# Patient Record
Sex: Female | Born: 1990 | Race: White | Hispanic: No | Marital: Single | Smoking: Current every day smoker
Health system: Southern US, Community
[De-identification: ages and names within clinical notes are randomized; demographics above are authoritative.]

## PROBLEM LIST (undated history)

## (undated) DIAGNOSIS — R011 Cardiac murmur, unspecified: Secondary | ICD-10-CM

## (undated) DIAGNOSIS — F329 Major depressive disorder, single episode, unspecified: Secondary | ICD-10-CM

## (undated) DIAGNOSIS — F32A Depression, unspecified: Secondary | ICD-10-CM

## (undated) DIAGNOSIS — J40 Bronchitis, not specified as acute or chronic: Secondary | ICD-10-CM

---

## 2010-04-13 ENCOUNTER — Emergency Department (HOSPITAL_COMMUNITY)
Admission: EM | Admit: 2010-04-13 | Discharge: 2010-04-13 | Payer: Self-pay | Source: Home / Self Care | Admitting: Emergency Medicine

## 2010-04-15 LAB — DIFFERENTIAL
Basophils Absolute: 0 10*3/uL (ref 0.0–0.1)
Basophils Relative: 1 % (ref 0–1)
Eosinophils Absolute: 0.2 10*3/uL (ref 0.0–0.7)
Eosinophils Relative: 3 % (ref 0–5)
Lymphocytes Relative: 26 % (ref 12–46)
Lymphs Abs: 2 10*3/uL (ref 0.7–4.0)
Monocytes Absolute: 0.7 10*3/uL (ref 0.1–1.0)
Monocytes Relative: 8 % (ref 3–12)
Neutro Abs: 5 10*3/uL (ref 1.7–7.7)
Neutrophils Relative %: 63 % (ref 43–77)

## 2010-04-15 LAB — COMPREHENSIVE METABOLIC PANEL
ALT: 14 U/L (ref 0–35)
AST: 24 U/L (ref 0–37)
Albumin: 3.5 g/dL (ref 3.5–5.2)
Alkaline Phosphatase: 49 U/L (ref 39–117)
BUN: 8 mg/dL (ref 6–23)
CO2: 25 mEq/L (ref 19–32)
Calcium: 9.3 mg/dL (ref 8.4–10.5)
Chloride: 107 mEq/L (ref 96–112)
Creatinine, Ser: 0.67 mg/dL (ref 0.4–1.2)
GFR calc Af Amer: >60 mL/min (ref >60)
GFR calc non Af Amer: >60 mL/min (ref >60)
Glucose, Bld: 84 mg/dL (ref 70–99)
Potassium: 4 mEq/L (ref 3.5–5.1)
Sodium: 140 mEq/L (ref 135–145)
Total Bilirubin: 0.4 mg/dL (ref 0.3–1.2)
Total Protein: 6.3 g/dL (ref 6.0–8.3)

## 2010-04-15 LAB — CBC
HCT: 36.6 % (ref 36.0–46.0)
Hemoglobin: 12.2 g/dL (ref 12.0–15.0)
MCH: 27.9 pg (ref 26.0–34.0)
MCHC: 33.3 g/dL (ref 30.0–36.0)
MCV: 83.8 fL (ref 78.0–100.0)
Platelets: 300 10*3/uL (ref 150–400)
RBC: 4.37 MIL/uL (ref 3.87–5.11)
RDW: 14.2 % (ref 11.5–15.5)
WBC: 8 10*3/uL (ref 4.0–10.5)

## 2010-04-15 LAB — POCT PREGNANCY, URINE: Preg Test, Ur: NEGATIVE

## 2010-04-15 LAB — URINALYSIS, ROUTINE W REFLEX MICROSCOPIC
Bilirubin Urine: NEGATIVE
Hgb urine dipstick: NEGATIVE
Ketones, ur: NEGATIVE mg/dL
Leukocytes, UA: NEGATIVE
Nitrite: NEGATIVE
Protein, ur: 30 mg/dL — AB
Specific Gravity, Urine: 1.015 (ref 1.005–1.030)
Urine Glucose, Fasting: NEGATIVE mg/dL
Urobilinogen, UA: 0.2 mg/dL (ref 0.0–1.0)
pH: 6.5 (ref 5.0–8.0)

## 2010-04-15 LAB — PREGNANCY, URINE: Preg Test, Ur: NEGATIVE

## 2010-04-15 LAB — URINE MICROSCOPIC-ADD ON

## 2010-04-15 LAB — LIPASE, BLOOD: Lipase: 23 U/L (ref 11–59)

## 2010-04-17 ENCOUNTER — Emergency Department (HOSPITAL_COMMUNITY)
Admission: EM | Admit: 2010-04-17 | Discharge: 2010-04-17 | Payer: Self-pay | Source: Home / Self Care | Admitting: Emergency Medicine

## 2010-04-20 LAB — DIFFERENTIAL
Basophils Absolute: 0.1 10*3/uL (ref 0.0–0.1)
Basophils Relative: 1 % (ref 0–1)
Eosinophils Absolute: 0.1 10*3/uL (ref 0.0–0.7)
Eosinophils Relative: 1 % (ref 0–5)
Lymphocytes Relative: 33 % (ref 12–46)
Lymphs Abs: 2.6 10*3/uL (ref 0.7–4.0)
Monocytes Absolute: 0.7 10*3/uL (ref 0.1–1.0)
Monocytes Relative: 8 % (ref 3–12)
Neutro Abs: 4.4 10*3/uL (ref 1.7–7.7)
Neutrophils Relative %: 56 % (ref 43–77)

## 2010-04-20 LAB — COMPREHENSIVE METABOLIC PANEL
ALT: 15 U/L (ref 0–35)
AST: 24 U/L (ref 0–37)
Albumin: 4.1 g/dL (ref 3.5–5.2)
Alkaline Phosphatase: 61 U/L (ref 39–117)
BUN: 6 mg/dL (ref 6–23)
CO2: 26 mEq/L (ref 19–32)
Calcium: 9.7 mg/dL (ref 8.4–10.5)
Chloride: 103 mEq/L (ref 96–112)
Creatinine, Ser: 0.69 mg/dL (ref 0.4–1.2)
GFR calc Af Amer: >60 mL/min (ref >60)
GFR calc non Af Amer: >60 mL/min (ref >60)
Glucose, Bld: 97 mg/dL (ref 70–99)
Potassium: 3.7 mEq/L (ref 3.5–5.1)
Sodium: 140 mEq/L (ref 135–145)
Total Bilirubin: 0.4 mg/dL (ref 0.3–1.2)
Total Protein: 7.4 g/dL (ref 6.0–8.3)

## 2010-04-20 LAB — HCG, QUANTITATIVE, PREGNANCY: hCG, Beta Chain, Quant, S: <2 m[IU]/mL (ref <5)

## 2010-04-20 LAB — URINE MICROSCOPIC-ADD ON

## 2010-04-20 LAB — WET PREP, GENITAL: Yeast Wet Prep HPF POC: NONE SEEN

## 2010-04-20 LAB — CBC
HCT: 40.8 % (ref 36.0–46.0)
Hemoglobin: 14.1 g/dL (ref 12.0–15.0)
MCH: 28.7 pg (ref 26.0–34.0)
MCHC: 34.6 g/dL (ref 30.0–36.0)
MCV: 82.9 fL (ref 78.0–100.0)
Platelets: 417 10*3/uL — ABNORMAL HIGH (ref 150–400)
RBC: 4.92 MIL/uL (ref 3.87–5.11)
RDW: 14.1 % (ref 11.5–15.5)
WBC: 7.8 10*3/uL (ref 4.0–10.5)

## 2010-04-20 LAB — URINALYSIS, ROUTINE W REFLEX MICROSCOPIC
Bilirubin Urine: NEGATIVE
Hgb urine dipstick: NEGATIVE
Ketones, ur: NEGATIVE mg/dL
Nitrite: NEGATIVE
Protein, ur: NEGATIVE mg/dL
Specific Gravity, Urine: 1.01 (ref 1.005–1.030)
Urine Glucose, Fasting: NEGATIVE mg/dL
Urobilinogen, UA: 0.2 mg/dL (ref 0.0–1.0)
pH: 6.5 (ref 5.0–8.0)

## 2010-04-20 LAB — LIPASE, BLOOD: Lipase: 17 U/L (ref 11–59)

## 2010-04-20 LAB — POCT PREGNANCY, URINE: Preg Test, Ur: NEGATIVE

## 2010-04-21 LAB — GC/CHLAMYDIA PROBE AMP, GENITAL
Chlamydia, DNA Probe: NEGATIVE
GC Probe Amp, Genital: NEGATIVE

## 2014-02-14 ENCOUNTER — Other Ambulatory Visit: Payer: Self-pay

## 2014-02-14 ENCOUNTER — Emergency Department (HOSPITAL_COMMUNITY)
Admission: EM | Admit: 2014-02-14 | Discharge: 2014-02-14 | Discharge status: Against Medical Advice | Payer: Self-pay | Attending: Emergency Medicine | Admitting: Emergency Medicine

## 2014-02-14 ENCOUNTER — Encounter (HOSPITAL_COMMUNITY): Payer: Self-pay | Admitting: Emergency Medicine

## 2014-02-14 DIAGNOSIS — Z8659 Personal history of other mental and behavioral disorders: Secondary | ICD-10-CM | POA: Insufficient documentation

## 2014-02-14 DIAGNOSIS — Z720 Tobacco use: Secondary | ICD-10-CM | POA: Insufficient documentation

## 2014-02-14 DIAGNOSIS — R0602 Shortness of breath: Secondary | ICD-10-CM | POA: Insufficient documentation

## 2014-02-14 DIAGNOSIS — R011 Cardiac murmur, unspecified: Secondary | ICD-10-CM | POA: Insufficient documentation

## 2014-02-14 DIAGNOSIS — Z8719 Personal history of other diseases of the digestive system: Secondary | ICD-10-CM | POA: Insufficient documentation

## 2014-02-14 HISTORY — DX: Depression, unspecified: F32.A

## 2014-02-14 HISTORY — DX: Bronchitis, not specified as acute or chronic: J40

## 2014-02-14 HISTORY — DX: Cardiac murmur, unspecified: R01.1

## 2014-02-14 HISTORY — DX: Major depressive disorder, single episode, unspecified: F32.9

## 2014-02-14 NOTE — ED Provider Notes (Signed)
CSN: [id number]     Arrival date & time 02/14/14  1727 History  This chart was scribed for No att. providers found by Rayfield Citizen, ED Scribe. This patient was seen in room APA14/APA14 and the patient's care was started at 7:23 PM.    Chief Complaint  Patient presents with  . Shortness of Breath  . Possible Pregnancy   Patient is a 23 y.o. female presenting with shortness of breath and pregnancy problem. The history is provided by the patient. No language interpreter was used.  Shortness of Breath Associated symptoms: no abdominal pain, no chest pain, no cough, no fever, no headaches, no rash, no sore throat and no vomiting   Possible Pregnancy Associated symptoms include shortness of breath. Pertinent negatives include no chest pain, no abdominal pain and no headaches.     HPI Comments: Lisa Rasmussen is a 23 y.o. female who presents to the Emergency Department complaining of    Past Medical History  Diagnosis Date  . Heart murmur   . Depression   . Bronchitis    History reviewed. No pertinent past surgical history. Family History  Problem Relation Age of Onset  . Cancer Other    History  Substance Use Topics  . Smoking status: Current Every Day Smoker -- 0.50 packs/day for 12 years    Types: Cigarettes  . Smokeless tobacco: Never Used  . Alcohol Use: No   OB History    Gravida Para Term Preterm AB TAB SAB Ectopic Multiple Living   _0 Review of Systems  Constitutional: Negative for fever and chills.  HENT: Negative for congestion and sore throat.   Respiratory: Positive for shortness of breath. Negative for cough.   Cardiovascular: Negative for chest pain.  Gastrointestinal: Negative for nausea, vomiting, abdominal pain and diarrhea.  Genitourinary: Negative for dysuria, frequency and hematuria.  Musculoskeletal: Negative for back pain.  Skin: Negative for rash.  Neurological: Negative for headaches.  Hematological: Does not bruise/bleed easily.   Psychiatric/Behavioral: Negative for confusion.      Allergies  Review of patient's allergies indicates no known allergies.  Home Medications   Prior to Admission medications   Not on File   BP 121/73 mmHg  Pulse 109  Temp(Src) 98.7 F (37.1 C) (Oral)  Resp 18  Ht _1  (1.499 m)  Wt 97 lb (43.999 kg)  BMI 19.58 kg/m2  SpO2 100%  LMP 09/25/2013 Physical Exam  Constitutional: She is oriented to person, place, and time. She appears well-developed and well-nourished.  HENT:  Head: Normocephalic and atraumatic.  Neck: No tracheal deviation present.  Cardiovascular: Normal rate.   Pulmonary/Chest: Effort normal.  Neurological: She is alert and oriented to person, place, and time.  Skin: Skin is warm and dry.  Psychiatric: She has a normal mood and affect. Her behavior is normal.  Nursing note and vitals reviewed.   ED Course  Procedures   DIAGNOSTIC STUDIES: Oxygen Saturation is 100% on RA, normal by my interpretation.    COORDINATION OF CARE: 7:23 PM Discussed treatment plan with pt at bedside and pt agreed to plan.   Labs Review Labs Reviewed  POC URINE PREG, ED   Results for orders placed or performed during the hospital encounter of 04/17/10  Wet prep, genital  Result Value Ref Range   Yeast Wet Prep HPF POC NONE SEEN NONE SEEN   Trich, Wet Prep FEW (A) NONE SEEN   Clue  Cells Wet Prep HPF POC MODERATE (A) NONE SEEN   WBC, Wet Prep HPF POC FEW (A) NONE SEEN  GC/chlamydia probe amp, genital  Result Value Ref Range   GC Probe Amp, Genital  NEGATIVE    NEGATIVE (NOTE)  Testing performed using the [employer] Qx Chlamydia trachomatis and Neisseria gonorrhea amplified DNA assay.  Performed at:  [facility] [address], [place] [address]               [id number]   Chlamydia, DNA Probe  NEGATIVE    NEGATIVE (NOTE)  Testing performed using the [employer] Qx  Chlamydia trachomatis and Neisseria gonorrhea amplified DNA assay.  Performed at:  [facility] [address]                [place], [place] [address]               [id number]  Differential  Result Value Ref Range   Neutrophils Relative % 56 43 - 77 %   Neutro Abs 4.4 1.7 - 7.7 K/uL   Lymphocytes Relative 33 12 - 46 %   Lymphs Abs 2.6 0.7 - 4.0 K/uL   Monocytes Relative 8 3 - 12 %   Monocytes Absolute 0.7 0.1 - 1.0 K/uL   Eosinophils Relative 1 0 - 5 %   Eosinophils Absolute 0.1 0.0 - 0.7 K/uL   Basophils Relative 1 0 - 1 %   Basophils Absolute 0.1 0.0 - 0.1 K/uL  CBC  Result Value Ref Range   WBC 7.8 4.0 - 10.5 K/uL   RBC 4.92 3.87 - 5.11 MIL/uL   Hemoglobin 14.1 12.0 - 15.0 g/dL   HCT 40.8 36.0 - 46.0 %   MCV 82.9 78.0 - 100.0 fL   MCH 28.7 26.0 - 34.0 pg   MCHC 34.6 30.0 - 36.0 g/dL   RDW 14.1 11.5 - 15.5 %   Platelets 417 (H) 150 - 400 K/uL  Urinalysis, Routine w reflex microscopic  Result Value Ref Range   Color, Urine YELLOW YELLOW   APPearance HAZY (A) CLEAR   Specific Gravity, Urine 1.010 1.005 - 1.030   pH 6.5 5.0 - 8.0   Urine Glucose, Fasting NEGATIVE NEGATIVE mg/dL   Hgb urine dipstick NEGATIVE NEGATIVE   Bilirubin Urine NEGATIVE NEGATIVE   Ketones, ur NEGATIVE NEGATIVE mg/dL   Protein, ur NEGATIVE NEGATIVE mg/dL   Urobilinogen, UA 0.2 0.0 - 1.0 mg/dL   Nitrite NEGATIVE NEGATIVE   Leukocytes, UA SMALL (A) NEGATIVE  Urine microscopic-add on  Result Value Ref Range   Squamous Epithelial / LPF RARE RARE   WBC, UA 3-6 <3 WBC/hpf   Bacteria, UA MANY (A) RARE  Comprehensive metabolic panel  Result Value Ref Range   Sodium 140 135 - 145 mEq/L   Potassium 3.7 3.5 - 5.1 mEq/L   Chloride 103 96 - 112 mEq/L   CO2 26 19 - 32 mEq/L   Glucose, Bld 97 70 - 99 mg/dL   BUN 6  6 - 23 mg/dL   Creatinine, Ser 0.69 0.4 - 1.2 mg/dL   Calcium 9.7 8.4 - 10.5 mg/dL   Total Protein 7.4 6.0 - 8.3 g/dL    Albumin 4.1 3.5 - 5.2 g/dL   AST 24 0 - 37 U/L   ALT 15 0 - 35 U/L   Alkaline Phosphatase 61 39 - 117 U/L   Total Bilirubin 0.4 0.3 - 1.2 mg/dL   GFR calc non Af Amer >60 >60 mL/min   GFR calc Af Amer  >60 mL/min    >60        The eGFR has been calculated using the MDRD equation. This calculation has not been validated in all clinical situations. eGFR's persistently <60 mL/min signify possible Chronic Kidney Disease.  hCG, quantitative, pregnancy  Result Value Ref Range   hCG, Beta Chain, Quant, S  <5 mIU/mL    <2          GEST. AGE      CONC.  (mIU/mL)   <=1 WEEK        5 - 50     2 WEEKS       50 - 500     3 WEEKS       100 - 10,000     4 WEEKS     1,000 - 30,000     5 WEEKS     3,500 - 115,000   6-8 WEEKS     12,000 - 270,000    12 WEEKS     15,000 - 220,000        FEMALE AND NON-PREGNANT FEMALE:     LESS THAN 5 mIU/mL  Lipase, blood  Result Value Ref Range   Lipase 17 11 - 59 U/L  Pregnancy, urine POC  Result Value Ref Range   Preg Test, Ur      NEGATIVE        THE SENSITIVITY OF THIS METHODOLOGY IS >24 mIU/mL     Imaging Review No results found.   EKG Interpretation None      MDM   Final diagnoses:  SOB (shortness of breath)    I personally performed the services described in this documentation, which was scribed in my presence. The recorded information has been reviewed and is accurate.    The patient's chart was prepped by the scribed in preparation for seeing the patient. With the patient left right before we were able to see her. Patient was never formally seen by physician. She is officially a left without being seen after triage. Not an AMA.     Fredia Sorrow, MD 02/14/14 [id number]

## 2014-02-14 NOTE — ED Notes (Signed)
Pt came to nurses station, states she has a family emergency and is leaving. I told pt we would be happy to see her at any time. Pt left in no distress, easily ambulatory with no respiratory difficulty evident

## 2014-02-14 NOTE — ED Notes (Addendum)
Patient c/o constant shortness of breath that is progressively getting worse x2 weeks. Per patient unable to do normal daily activities due to the shortness of breath. Cough present. Per patient has chronic bronchitis. 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  Patient also believes she may be pregnant. Strong odor with urination, requesting STD check. Reports nausea, chest pain and dizziness.

## 2014-07-28 ENCOUNTER — Encounter (HOSPITAL_COMMUNITY): Payer: Self-pay | Admitting: Cardiology

## 2014-07-28 ENCOUNTER — Emergency Department (HOSPITAL_COMMUNITY): Payer: Self-pay

## 2014-07-28 ENCOUNTER — Emergency Department (HOSPITAL_COMMUNITY)
Admission: EM | Admit: 2014-07-28 | Discharge: 2014-07-28 | Disposition: A | Discharge status: Home / Self Care | Payer: Self-pay | Attending: Emergency Medicine | Admitting: Emergency Medicine

## 2014-07-28 DIAGNOSIS — G44319 Acute post-traumatic headache, not intractable: Secondary | ICD-10-CM | POA: Insufficient documentation

## 2014-07-28 DIAGNOSIS — Y998 Other external cause status: Secondary | ICD-10-CM | POA: Insufficient documentation

## 2014-07-28 DIAGNOSIS — F0781 Postconcussional syndrome: Secondary | ICD-10-CM | POA: Insufficient documentation

## 2014-07-28 DIAGNOSIS — Z720 Tobacco use: Secondary | ICD-10-CM | POA: Insufficient documentation

## 2014-07-28 DIAGNOSIS — Z3202 Encounter for pregnancy test, result negative: Secondary | ICD-10-CM | POA: Insufficient documentation

## 2014-07-28 DIAGNOSIS — Y92410 Unspecified street and highway as the place of occurrence of the external cause: Secondary | ICD-10-CM | POA: Insufficient documentation

## 2014-07-28 DIAGNOSIS — Z8659 Personal history of other mental and behavioral disorders: Secondary | ICD-10-CM | POA: Insufficient documentation

## 2014-07-28 DIAGNOSIS — S161XXA Strain of muscle, fascia and tendon at neck level, initial encounter: Secondary | ICD-10-CM | POA: Insufficient documentation

## 2014-07-28 DIAGNOSIS — Y9389 Activity, other specified: Secondary | ICD-10-CM | POA: Insufficient documentation

## 2014-07-28 DIAGNOSIS — R011 Cardiac murmur, unspecified: Secondary | ICD-10-CM | POA: Insufficient documentation

## 2014-07-28 LAB — POC URINE PREG, ED: Preg Test, Ur: NEGATIVE

## 2014-07-28 MED ORDER — KETOROLAC TROMETHAMINE 60 MG/2ML IM SOLN
60.0000 mg | Freq: Once | INTRAMUSCULAR | Status: AC
  Administered 2014-07-28: 60 mg via INTRAMUSCULAR
  Filled 2014-07-28: qty 2

## 2014-07-28 MED ORDER — ONDANSETRON 8 MG PO TBDP
8.0000 mg | ORAL_TABLET | Freq: Once | ORAL | Status: AC
  Administered 2014-07-28: 8 mg via ORAL
  Filled 2014-07-28: qty 1

## 2014-07-28 MED ORDER — NAPROXEN 500 MG PO TABS
500.0000 mg | ORAL_TABLET | Freq: Two times a day (BID) | ORAL | Status: DC
Start: 2014-07-28 — End: 2014-10-07

## 2014-07-28 MED ORDER — ONDANSETRON HCL 4 MG PO TABS: 4.0000 mg | ORAL_TABLET | Freq: Three times a day (TID) | ORAL | Status: AC | PRN
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
## 2014-07-28 NOTE — Discharge Instructions (Signed)
Concussion °A concussion is a brain injury. It is caused by: °· A hit to the head. °· A quick and sudden movement (jolt) of the head or neck. °A concussion is usually not life threatening. Even so, it can cause serious problems. If you had a concussion before, you may have concussion-like problems after a hit to your head. °HOME CARE °General Instructions °· Follow your doctor's directions carefully. °· Take medicines only as told by your doctor. °· Only take medicines your doctor says are safe. °· Do not drink alcohol until your doctor says it is okay. Alcohol and some drugs can slow down healing. They can also put you at risk for further injury. °· If you are having trouble remembering things, write them down. °· Try to do one thing at a time if you get distracted easily. For example, do not watch TV while making dinner. °· Talk to your family members or close friends when making important decisions. °· Follow up with your doctor as told. °· Watch your symptoms. Tell others to do the same. Serious problems can sometimes happen after a concussion. Older adults are more likely to have these problems. °· Tell your teachers, school nurse, school counselor, coach, athletic trainer, or work manager about your concussion. Tell them about what you can or cannot do. They should watch to see if: °¨ It gets even harder for you to pay attention or concentrate. °¨ It gets even harder for you to remember things or learn new things. °¨ You need more time than normal to finish things. °¨ You become annoyed (irritable) more than before. °¨ You are not able to deal with stress as well. °¨ You have more problems than before. °· Rest. Make sure you: °¨ Get plenty of sleep at night. °¨ Go to sleep early. °¨ Go to bed at the same time every day. Try to wake up at the same time. °¨ Rest during the day. °¨ Take naps when you feel tired. °· Limit activities where you have to think a lot or concentrate. These include: °¨ Doing  homework. °¨ Doing work related to a job. °¨ Watching TV. °¨ Using the computer. °Returning To Your Regular Activities °Return to your normal activities slowly, not all at once. You must give your body and brain enough time to heal.  °· Do not play sports or do other athletic activities until your doctor says it is okay. °· Ask your doctor when you can drive, ride a bicycle, or work other vehicles or machines. Never do these things if you feel dizzy. °· Ask your doctor about when you can return to work or school. °Preventing Another Concussion °It is very important to avoid another brain injury, especially before you have healed. In rare cases, another injury can lead to permanent brain damage, brain swelling, or death. The risk of this is greatest during the first 7-10 days after your injury. Avoid injuries by:  °· Wearing a seat belt when riding in a car. °· Not drinking too much alcohol. °· Avoiding activities that could lead to a second concussion (such as contact sports). °· Wearing a helmet when doing activities like: °¨ Biking. °¨ Skiing. °¨ Skateboarding. °¨ Skating. °· Making your home safer by: °¨ Removing things from the floor or stairways that could make you trip. °¨ Using grab bars in bathrooms and handrails by stairs. °¨ Placing non-slip mats on floors and in bathtubs. °¨ Improve lighting in dark areas. °GET HELP IF: °· It   gets even harder for you to pay attention or concentrate.  It gets even harder for you to remember things or learn new things.  You need more time than normal to finish things.  You become annoyed (irritable) more than before.  You are not able to deal with stress as well.  You have more problems than before.  You have problems keeping your balance.  You are not able to react quickly when you should. Get help if you have any of these problems for more than 2 weeks:   Lasting (chronic) headaches.  Dizziness or trouble balancing.  Feeling sick to your stomach  (nausea).  Seeing (vision) problems.  Being affected by noises or light more than normal.  Feeling sad, low, down in the dumps, blue, gloomy, or empty (depressed).  Mood changes (mood swings).  Feeling of fear or nervousness about what may happen (anxiety).  Feeling annoyed.  Memory problems.  Problems concentrating or paying attention.  Sleep problems.  Feeling tired all the time. GET HELP RIGHT AWAY IF:   You have bad headaches or your headaches get worse.  You have weakness (even if it is in one hand, leg, or part of the face).  You have loss of feeling (numbness).  You feel off balance.  You keep throwing up (vomiting).  You feel tired.  One black center of your eye (pupil) is larger than the other.  You twitch or shake violently (convulse).  Your speech is not clear (slurred).  You are more confused, easily angered (agitated), or annoyed than before.  You have more trouble resting than before.  You are unable to recognize people or places.  You have neck pain.  It is difficult to wake you up.  You have unusual behavior changes.  You pass out (lose consciousness). MAKE SURE YOU:   Understand these instructions.  Will watch your condition.  Will get help right away if you are not doing well or get worse. Document Released: 03/03/2009 Document Revised: 07/30/2013 Document Reviewed: 10/05/2012 [facility] Patient Information 2015 [place], [place]. This information is not intended to replace advice given to you by your health care provider. Make sure you discuss any questions you have with your health care provider.    Emergency Department Resource Guide 1) Find a Doctor and Pay Out of Pocket Although you won't have to find out who is covered by your insurance plan, it is a good idea to ask around and get recommendations. You will then need to call the office and see if the doctor you have chosen will accept you as a new patient and what types of  options they offer for patients who are self-pay. Some doctors offer discounts or will set up payment plans for their patients who do not have insurance, but you will need to ask so you aren't surprised when you get to your appointment.  2) Contact Your Local Health Department Not all health departments have doctors that can see patients for sick visits, but many do, so it is worth a call to see if yours does. If you don't know where your local health department is, you can check in your phone book. The CDC also has a tool to help you locate your state's health department, and many state websites also have listings of all of their local health departments.  3) Find a Walk-in Clinic If your illness is not likely to be very severe or complicated, you may want to try a walk in clinic. These  are popping up all over the country in pharmacies, drugstores, and shopping centers. They're usually staffed by nurse practitioners or physician assistants that have been trained to treat common illnesses and complaints. They're usually fairly quick and inexpensive. However, if you have serious medical issues or chronic medical problems, these are probably not your best option.  No Primary Care Doctor: - Call Health Connect at  [phone] - they can help you locate a primary care doctor that  accepts your insurance, provides certain services, etc. - Physician Referral Service- [phone]  Chronic Pain Problems: Organization         Address  Phone   Notes  [facility]  [phone] Patients need to be referred by their primary care doctor.   Medication Assistance: Organization         Address  Phone   Notes  [facility] Medication [facility] [address] [place], [place] [address] [phone]) [phone] --Must be a resident of [place] [facility] [place] -- Must have NO insurance coverage whatsoever (no Medicaid/ Medicare, etc.) -- The pt. MUST have a primary care doctor that directs  their care regularly and follows them in the community   [facility]  [phone]) [phone]   [employer]  [phone]    Agencies that provide inexpensive medical care: Organization         Address  Phone   Notes  [facility]  [phone]) [phone]   [facility]    [phone]) [phone]   [facility] [address] [place], [place] [address] [phone]) [phone]   [facility] [address], [place] [phone]) [phone]   Planned Parenthood    [phone]   [facility]    [phone]   [facility]  [address], [place] Phone:  [phone](336) [phone], Fax:  [phone] Hours of Operation:  9 am - 6 pm, M-F.  Also accepts Medicaid/Medicare and self-pay.  [facility]  [address], [place] Phone: [phone], Fax: [phone]. Hours of Operation:  8:30 am - 5:30 pm, M-F.  Also accepts Medicaid and self-pay.  [facility] [place] [address], [place] Phone: [phone]) [phone]   [facility] [address], [place], [place] [phone], Ext. [phone] Mondays & Thursdays: 7-9 AM.  First 15 patients are seen on a first come, first serve basis.    Medicaid-accepting [facility] Providers:  Organization         Address  Phone   Notes  [facility] [address], [place] [phone] Also accepts self-pay patients.  [facility] [address], [place]  [phone]) [phone]   [facility] [address], [place] [phone]) [phone]   [facility] [address] [place] [address], [place] [phone]) [phone]   Renaye RakersVeita Bland [address], [place]   [phone] Only accepts [place] Access [place] patients after they have their name applied to their card.   Self-Pay (no insurance) in [facility]:  Organization          Address  Phone   Notes  Sickle Cell Patients, [facility] [address], [place] [phone]) [phone]   [facility] [address], [place] [phone]) [phone]   [facility] [place]  [address], [place] [phone]   [facility]/Dr. Osei-Bonsu  [address], [place] or [address], [place] [phone] Phone number for both [place] and [place] locations is the same.  [facility] [address], [place] [phone]   [facility] [address] [place] or [address] [phone]   [facility] [address] [place] [phone], phone; [phone], fax Sees patients 1st and 3rd Saturday of every month.  Must not qualify for public or private insurance (i.e. Medicaid, Medicare, [place] Health Choice, Veterans' Benefits)  Household income should be no more than 200% of the poverty level The clinic cannot treat you if you are pregnant or think you are pregnant  Sexually transmitted diseases are not treated at the clinic.    Dental Care: Organization         Address  Phone  Notes  [facility] Department of [facility] [address] [place], [place] [phone] Accepts children up to age 64 who are enrolled in [place] or [place] Health Choice; pregnant women with a Medicaid card; and children who have applied for Medicaid or [place] Health Choice, but were declined, whose parents can pay a reduced fee at time of service.  [facility] Department of [facility]  [address], [place] [phone] Accepts children up to age 84 who are enrolled in [place] or [place] Health Choice; pregnant women with a Medicaid card; and children who have applied for Medicaid or [place] Health Choice, but were declined, whose parents can pay a reduced fee at time of  service.  [facility]  [address] [place], [place] [phone] Patients are seen by appointment only. Walk-ins are not accepted. [facility] will see patients 62 years of age and older. Monday - Tuesday (8am-5pm) Most Wednesdays (8:30-5pm) $30 per visit, cash only  [facility] Access [facility]  [address], [facility] [phone] Patients are seen by appointment only. Walk-ins are not accepted. [facility] will see patients 58 years of age and older. One Wednesday Evening (Monthly: Volunteer Based).  $30 per visit, cash only  [employer]  [phone] for adults; Children under age 52, call Graduate Pediatric Dentistry at [phone]. Children aged 45-14, please call [phone] to request a pediatric application.  Dental services are provided in all areas of dental care including fillings, crowns and bridges, complete and partial dentures, implants, gum treatment, root canals, and extractions. Preventive care is also provided. Treatment is provided to both adults and children. Patients are selected via a lottery and there is often a waiting list.   [facility] [address], [place]  [phone] [email or web address]   [facility] [address] [place], [place] [phone], Ext. [phone] Second and Fourth Thursday of each month, opens at 6:30 AM; Clinic ends at 9 AM.  Patients are seen on a first-come first-served basis, and a limited number are seen during each clinic.   [facility]  [address] [place] [place], [place] [phone]   Eligibility Requirements You must have lived in [place], [place], or [place] counties for at least the last three months.   You cannot be eligible for state or federal sponsored [place], including [employer], [place], or  Medicare.   You generally cannot be eligible for healthcare insurance through your employer.     How to apply: Eligibility screenings are held every Tuesday and Wednesday afternoon from 1:00 pm until 4:00 pm. You do not need an appointment for the interview!  [facility] [address] [place], [place] [phone]   [facility] Health Department  [phone]   [facility] Health Department  [phone]   [facility] Health Department  [phone]    Behavioral Health Resources in the Community: Intensive Outpatient Programs Organization         Address  Phone  Notes  [facility] [address] [place], [place] [phone]   [facility] [address] [place], [place] [phone]   [facility] [address] [place], [place]  [phone]   [facility] Mental Health [address],  [place], [place] [phone] or [phone]   Substance Abuse Resources Organization         Address  Phone  Notes  [facility]  [phone]   [facility]  [phone]   [facility] [place]  [phone]   Floydene Flock  [phone]   Residential & Outpatient Substance Abuse Program  [phone]   Psychological Services Organization         Address  Phone  Notes  [facility]  [phone]   [facility]  [phone]   [facility] Mental Health [address], [place] [phone] or [phone]    Mobile Crisis Teams Organization         Address  Phone  Notes  [facility], Mobile Crisis Care Unit  [phone]   [facility]  [address] [place], [place] [phone]   Doristine Locks [address] [place] [phone]    Self-Help/Support Groups Organization         Address  Phone             Notes  [facility] [place] - variety of support groups  336- I7437963 Call for more information  Narcotics Anonymous (NA), [facility] [address]  Dr, [employer] [place]  2 meetings at this location   Statistician         Address  Phone  Notes  [facility] [address],    [place]  [phone]   [facility]  [address], [place] [address], [place], [place] [phone]   [facility] [address] [place], [place] [phone] Admissions: 8am-3pm M-F  [facility] [address],    [place], [place] [phone]   [facility] [address] [place], [place], [place] [phone]   [facility] [address]  [place], [place] [phone]   [facility] - Intensive Outpatient [address], [place], [place] [phone]   [facility] [address]  [place], [place] [phone] or [phone]   [facility] ([facility]) [address], [place], [place] [phone] Accepts Medicaid  [facility] [address]  [place] [phone] Substance Abuse/Addiction Treatment   [facility] Organization         Address  Phone  Notes  [facility]  [phone]   Angie Fava, PhD [address] [place], [place]   [phone] or [phone]   [facility] Gainer  Behavioral   [address] [place], [place] [phone]   [facility] [address] [place], [place] [phone] Insurance/Medicaid/sponsorship through [facility] and Families [address]                                    [place], [place] [phone] Therapy/tele-psych/case  [facility] [address]   [place], [place] [phone]    Dr. Lolly Mustache  [phone]   [facility]  [employer] [facility] Dept. 1) [address], [place] 2) [address], [place] 3)  [address], [place] [phone]  [phone]   [facility] Child Abuse  Hotline 8500577897 or [phone] (After Hours)
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
## 2014-07-28 NOTE — ED Notes (Signed)
Pt unable to provide urine sample at this time 

## 2014-07-28 NOTE — ED Notes (Signed)
Patient had graham crackers and water and tolerated well. No nausea.

## 2014-10-06 ENCOUNTER — Emergency Department (HOSPITAL_COMMUNITY)
Admission: EM | Admit: 2014-10-06 | Discharge: 2014-10-07 | Disposition: A | Discharge status: Home / Self Care | Payer: Self-pay | Attending: Emergency Medicine | Admitting: Emergency Medicine

## 2014-10-06 ENCOUNTER — Encounter (HOSPITAL_COMMUNITY): Payer: Self-pay

## 2014-10-06 DIAGNOSIS — M542 Cervicalgia: Secondary | ICD-10-CM

## 2014-10-06 DIAGNOSIS — Z3202 Encounter for pregnancy test, result negative: Secondary | ICD-10-CM | POA: Insufficient documentation

## 2014-10-06 DIAGNOSIS — S199XXA Unspecified injury of neck, initial encounter: Secondary | ICD-10-CM | POA: Insufficient documentation

## 2014-10-06 DIAGNOSIS — R011 Cardiac murmur, unspecified: Secondary | ICD-10-CM | POA: Insufficient documentation

## 2014-10-06 DIAGNOSIS — S29092A Other injury of muscle and tendon of back wall of thorax, initial encounter: Secondary | ICD-10-CM | POA: Insufficient documentation

## 2014-10-06 DIAGNOSIS — Z720 Tobacco use: Secondary | ICD-10-CM | POA: Insufficient documentation

## 2014-10-06 DIAGNOSIS — Z8709 Personal history of other diseases of the respiratory system: Secondary | ICD-10-CM | POA: Insufficient documentation

## 2014-10-06 DIAGNOSIS — R519 Headache, unspecified: Secondary | ICD-10-CM

## 2014-10-06 DIAGNOSIS — Y998 Other external cause status: Secondary | ICD-10-CM | POA: Insufficient documentation

## 2014-10-06 DIAGNOSIS — F1910 Other psychoactive substance abuse, uncomplicated: Secondary | ICD-10-CM

## 2014-10-06 DIAGNOSIS — Y92410 Unspecified street and highway as the place of occurrence of the external cause: Secondary | ICD-10-CM | POA: Insufficient documentation

## 2014-10-06 DIAGNOSIS — S060X0A Concussion without loss of consciousness, initial encounter: Secondary | ICD-10-CM | POA: Insufficient documentation

## 2014-10-06 DIAGNOSIS — F1110 Opioid abuse, uncomplicated: Secondary | ICD-10-CM | POA: Insufficient documentation

## 2014-10-06 DIAGNOSIS — R51 Headache: Secondary | ICD-10-CM

## 2014-10-06 DIAGNOSIS — Y9389 Activity, other specified: Secondary | ICD-10-CM | POA: Insufficient documentation

## 2014-10-06 LAB — POC URINE PREG, ED: Preg Test, Ur: NEGATIVE
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
## 2014-10-07 ENCOUNTER — Emergency Department (HOSPITAL_COMMUNITY): Payer: Self-pay

## 2014-10-07 MED ORDER — NAPROXEN 375 MG PO TABS: 375.0000 mg | ORAL_TABLET | Freq: Two times a day (BID) | ORAL | Status: AC

## 2014-10-07 MED ORDER — METOCLOPRAMIDE HCL 5 MG/ML IJ SOLN
10.0000 mg | Freq: Once | INTRAMUSCULAR | Status: AC
  Administered 2014-10-07: 10 mg via INTRAMUSCULAR
  Filled 2014-10-07: qty 2

## 2014-10-07 MED ORDER — CYCLOBENZAPRINE HCL 10 MG PO TABS: 10.0000 mg | ORAL_TABLET | Freq: Three times a day (TID) | ORAL | Status: AC | PRN

## 2014-10-07 MED ORDER — KETOROLAC TROMETHAMINE 60 MG/2ML IM SOLN
60.0000 mg | Freq: Once | INTRAMUSCULAR | Status: AC
  Administered 2014-10-07: 60 mg via INTRAMUSCULAR
  Filled 2014-10-07: qty 2

## 2014-10-07 MED ORDER — DIPHENHYDRAMINE HCL 50 MG/ML IJ SOLN
25.0000 mg | Freq: Once | INTRAMUSCULAR | Status: AC
  Administered 2014-10-07: 25 mg via INTRAMUSCULAR
  Filled 2014-10-07: qty 1

## 2014-10-07 NOTE — Discharge Instructions (Signed)
Ice packs and heat to the injured or sore muscles for the next several days.Take the medications for pain and muscle spasms. Return to the ED for any problems listed on the head injury sheet. Recheck if you aren't improving in the next week.  Call Dr Ronal Fearoonquah's office to get an appointment for him to evaluate you for your concussion. 

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
